# Patient Record
Sex: Male | Born: 1985 | Race: White | Hispanic: No | Marital: Married | Smoking: Current every day smoker
Health system: Southern US, Community
[De-identification: ages and names within clinical notes are randomized; demographics above are authoritative.]

---

## 2009-06-09 ENCOUNTER — Emergency Department (HOSPITAL_COMMUNITY): Admission: EM | Admit: 2009-06-09 | Discharge: 2009-06-09 | Payer: Self-pay | Admitting: Family Medicine

## 2011-08-16 ENCOUNTER — Ambulatory Visit: Payer: Self-pay | Admitting: Family Medicine

## 2011-08-16 VITALS — BP 115/62 | HR 88 | Temp 98.7°F | Resp 18 | Ht 70.5 in | Wt 230.0 lb

## 2011-08-16 DIAGNOSIS — Z021 Encounter for pre-employment examination: Secondary | ICD-10-CM

## 2011-08-16 DIAGNOSIS — Z0289 Encounter for other administrative examinations: Secondary | ICD-10-CM

## 2011-08-16 NOTE — Progress Notes (Signed)
    Date:  08/16/2011   Name:  Shawn [name]   DOB:  11/28/1985   MRN:  [id number]  PCP:  No primary provider on file.    Chief Complaint: Employment Physical   History of Present Illness:  Shawn [name] [name] a 26 y.o. very pleasant male patient who presents with the following:  Here today for a DOT exam.  He last had his eyes checked a year ago.  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  He was told that corrective lenses would not help with this.  He [name] otherwise healthy and has no other concerns today.  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   He currently [name] a Visual merchandiser, has not had any driving accidents and does not have any trouble day to day with his vision.  There [name] no problem list on file for this patient.   No past medical history on file.  No past surgical history on file.  History  Substance Use Topics  . Smoking status: Current Everyday Smoker -- 0.5 packs/day for 5 years    Types: Cigarettes  . Smokeless tobacco: Not on file  . Alcohol Use: Not on file    No family history on file.  No Known Allergies  Medication list has been reviewed and updated.  No current outpatient prescriptions on file prior to visit.    Review of Systems:  As per HPI- otherwise negative. He [name] otherwise healthy  Physical Examination: Filed Vitals:   08/16/11 1316  BP: 115/62  Pulse: 88  Temp: 98.7 F (37.1 C)  Resp: 18   Filed Vitals:   08/16/11 1316  Height: 5' 10.5" (1.791 m)  Weight: 230 lb (104.327 kg)   Body mass index [name] 32.54 kg/(m^2). Ideal Body Weight: Weight in (lb) to have BMI = 25: 176.4   GEN: WDWN, NAD, Non-toxic, A & O x 3 HEENT: Atraumatic, Normocephalic. Neck supple. No masses, No LAD.  PEERL, EOMI, TM and oropharynx wnl.  Good cervical ROM to compensate for poor right eye vision.    Ears and Nose: No external deformity. CV: RRR, No M/G/R. No JVD. No thrill. No extra heart sounds. PULM: CTA B, no wheezes, crackles, rhonchi. No retractions. No resp. distress. No accessory muscle use. ABD: S, NT, ND, +BS. No rebound. No HSM. EXTR: No c/c/e NEURO Normal gait.  PSYCH: Normally interactive. Conversant. Not depressed or anxious appearing.  Calm demeanor.  Gu: no inguinal hernia.    Assessment and Plan: 1. Physical exam, pre-employment    Due to his amblyopia, Shawn [name] has 20/ 70 vision in his right eye.  Explained that I cannot certify him to drive without an examination and opinion from an optometrist or ophthalmologist.  He [name] frustrated and worried about his future employment, but understands.  Gave him a letter to share with the eye professional of his choice.  If he [name] able to receive a waiver for his vision I can certify him for one year.    Abbe Amsterdam, MD

## 2011-09-23 IMAGING — CR DG ANKLE COMPLETE 3+V*R*
3 series · 3 of 3 positions shown · non-contrast
Comparison: None.

CLINICAL DATA: Ankle pain post fall

RIGHT ANKLE - COMPLETE 3+ VIEW

[view not recorded (1 of 3)]
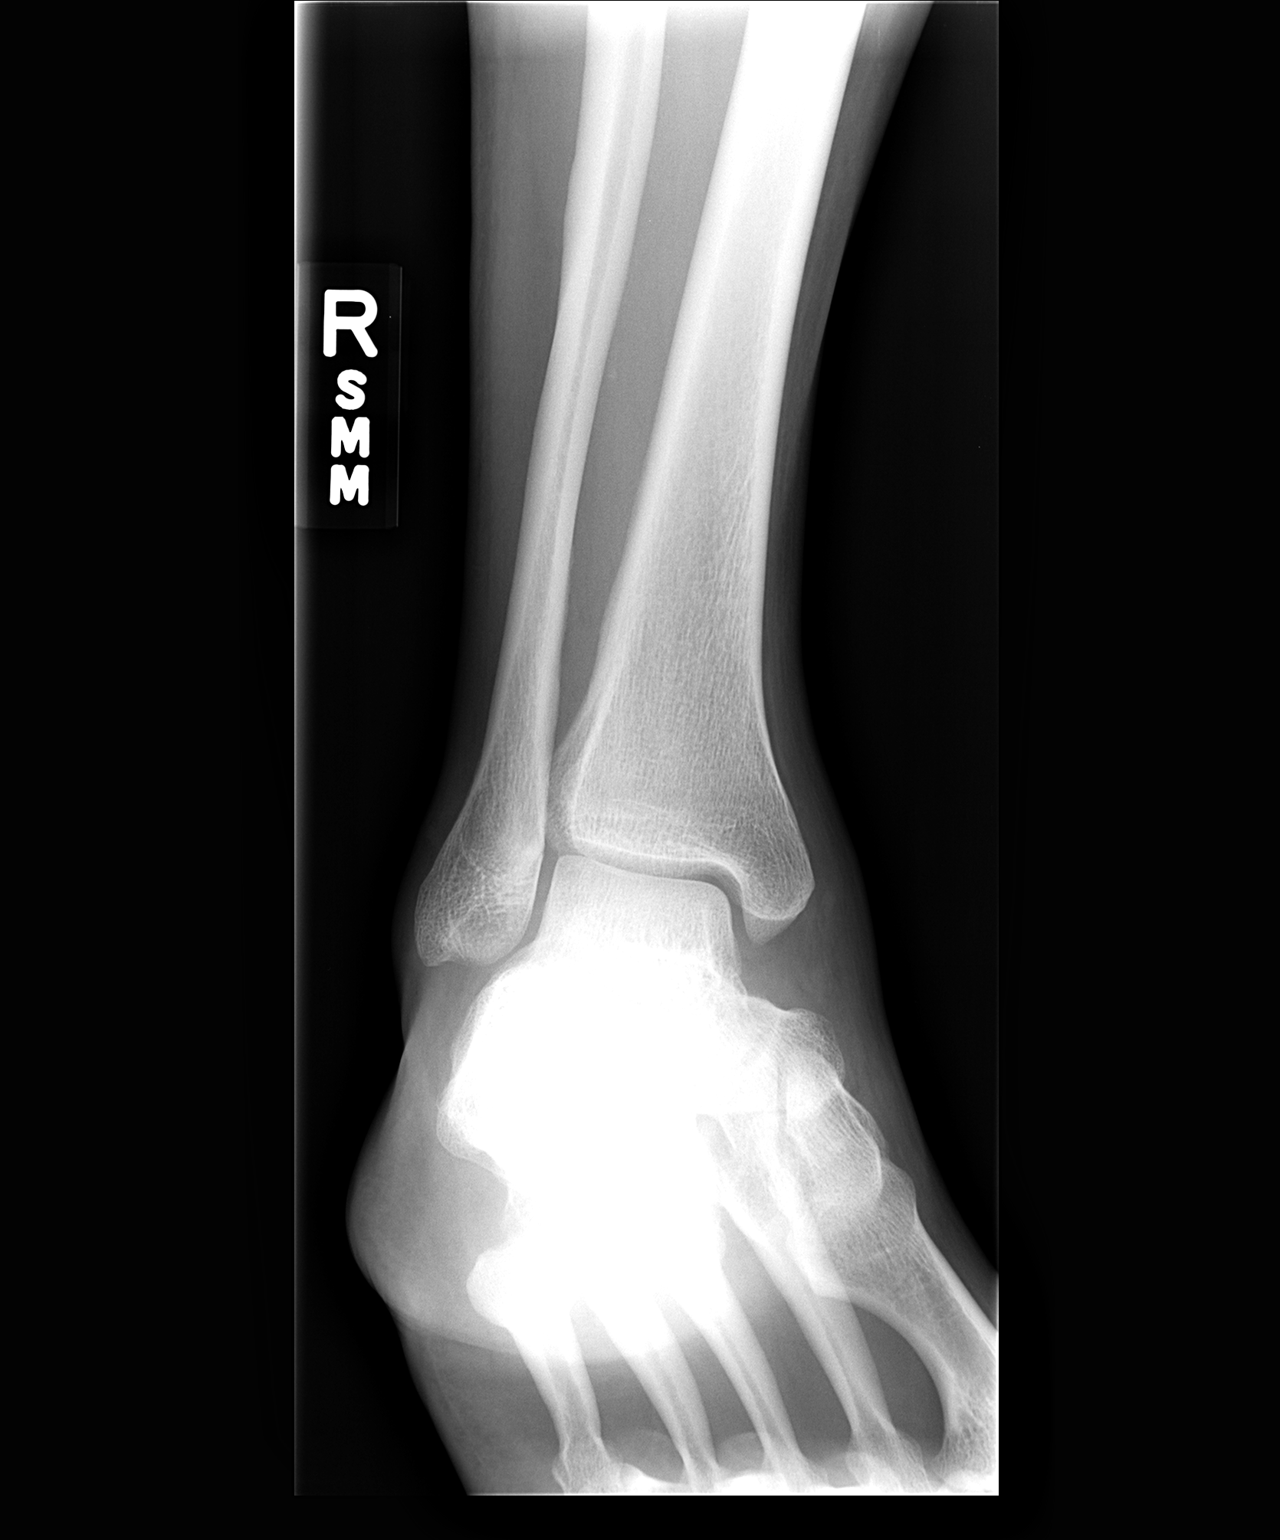

[view not recorded (2 of 3)]
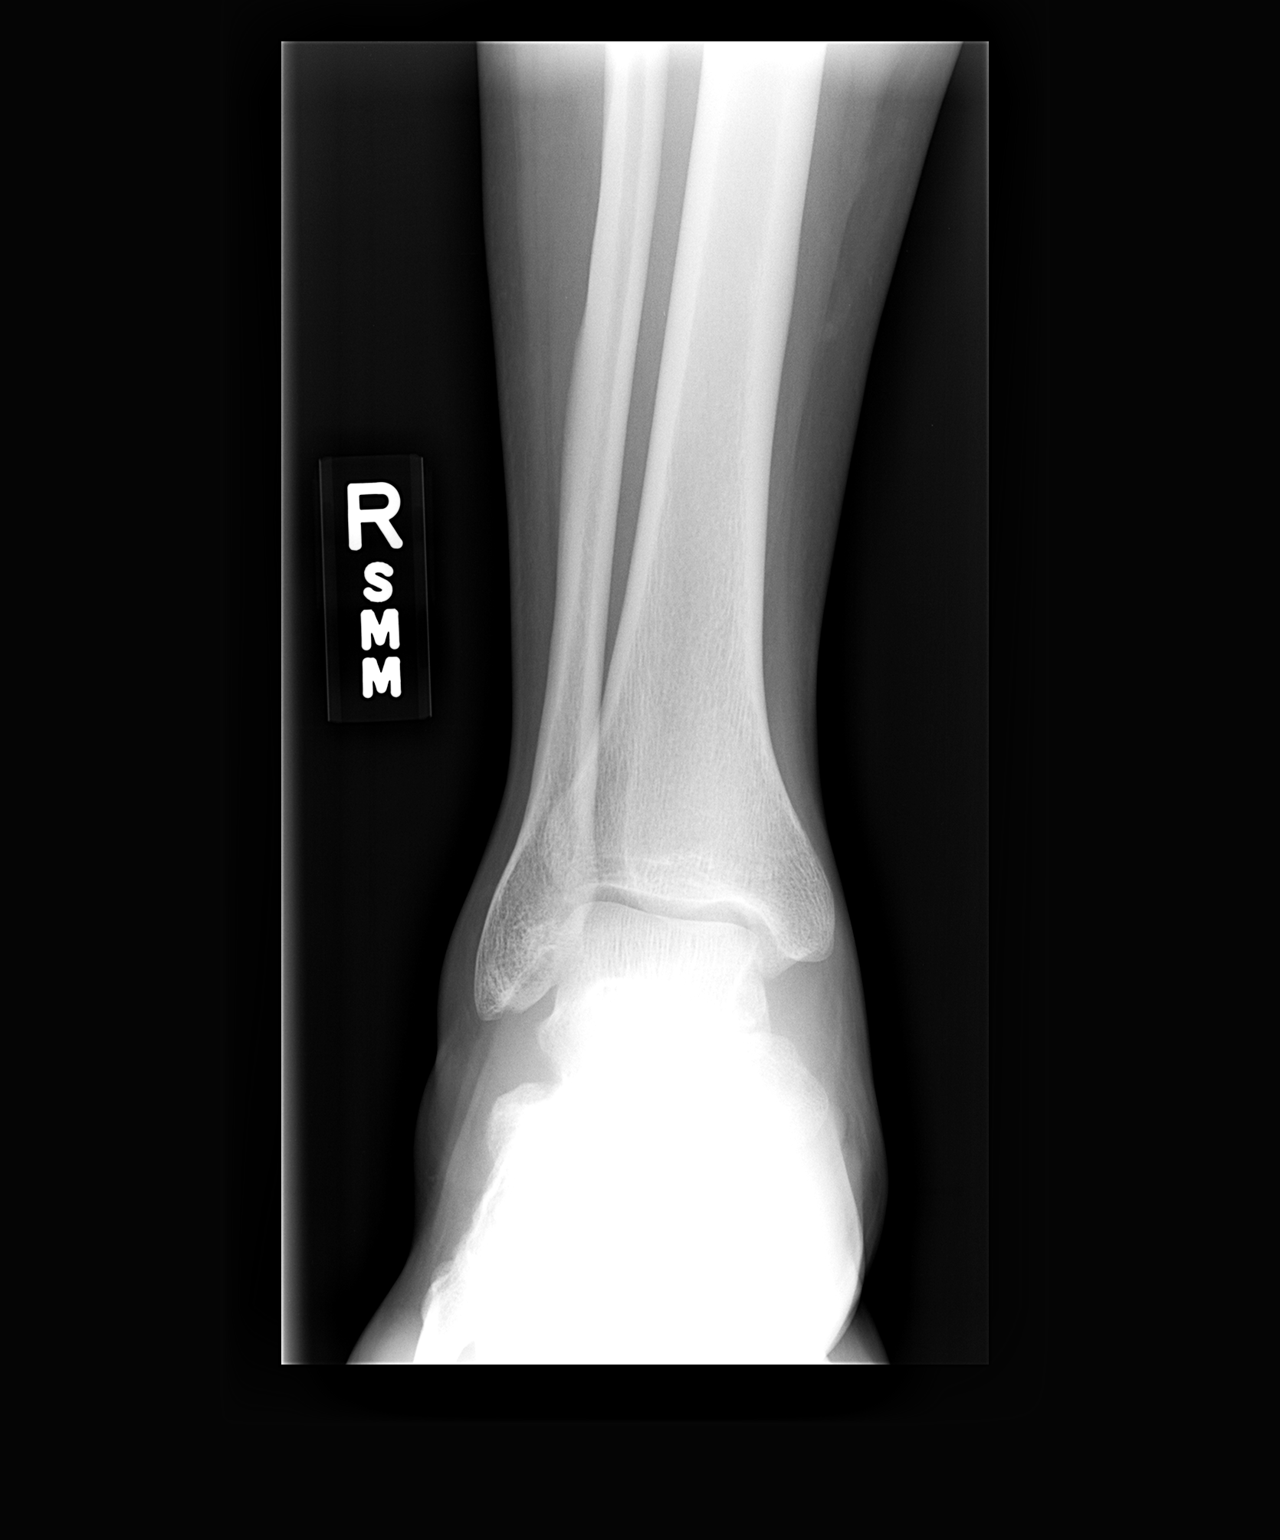

[view not recorded (3 of 3)]
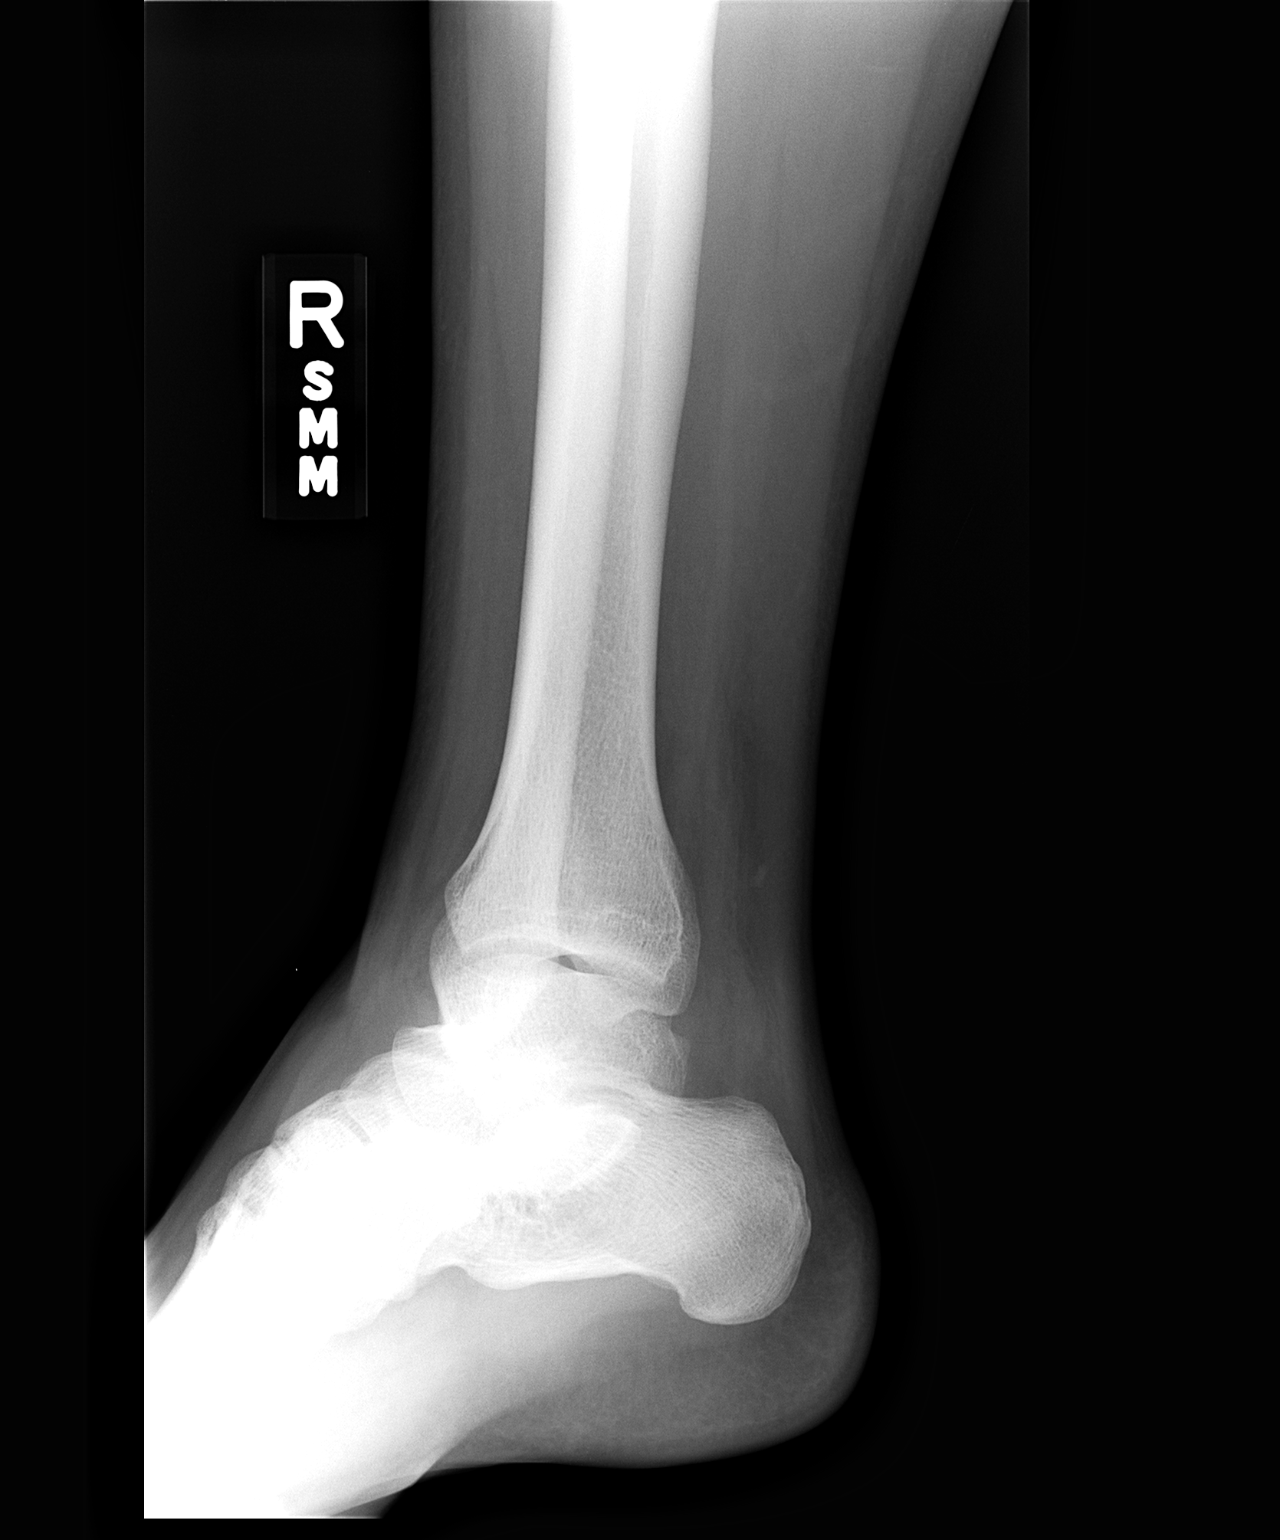

[3 of 3 positions shown; findings below may reference images not displayed]

FINDINGS: Three views of the right ankle submitted.  No acute
fracture or subluxation.  Ankle mortise is preserved.
IMPRESSION: No acute fracture or subluxation.

## 2011-09-23 IMAGING — CR DG FOOT COMPLETE 3+V*R*
3 series · 3 of 3 positions shown · non-contrast
Comparison: None.

CLINICAL DATA: Post fall

RIGHT FOOT COMPLETE - 3+ VIEW

[view not recorded (1 of 3)]
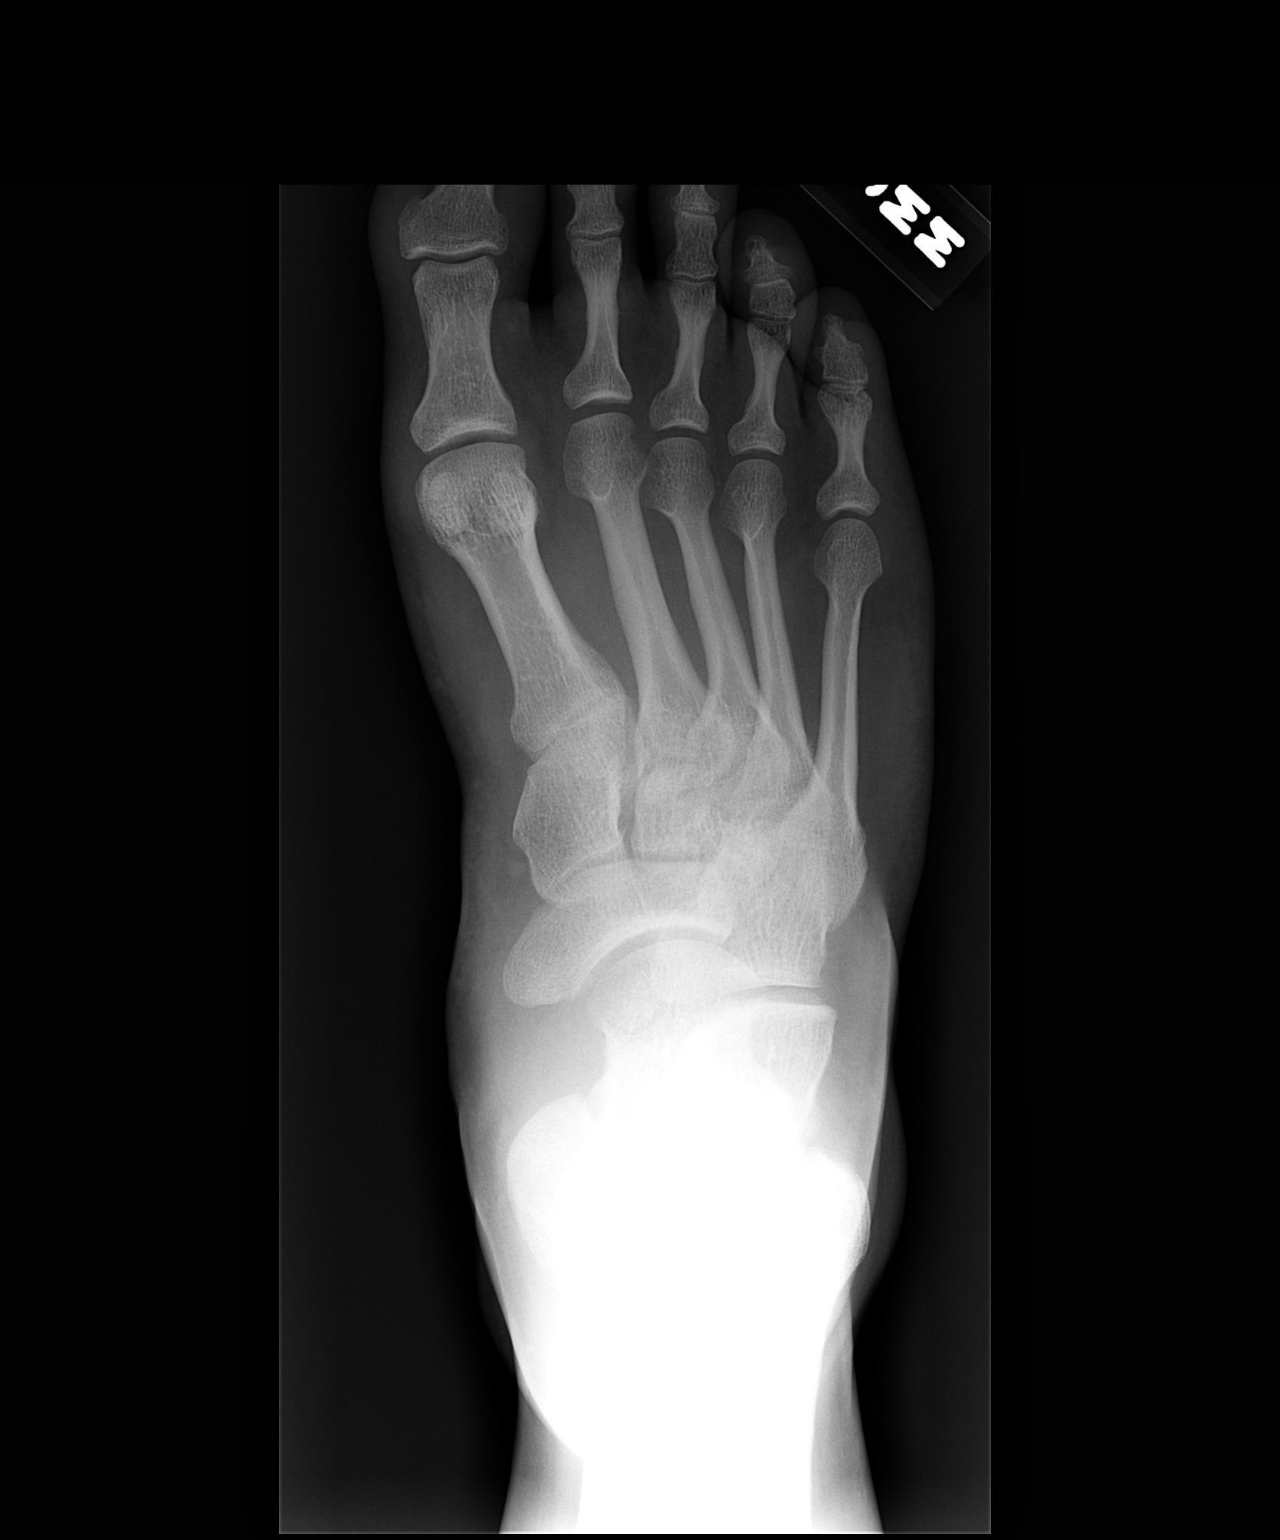

[view not recorded (2 of 3)]
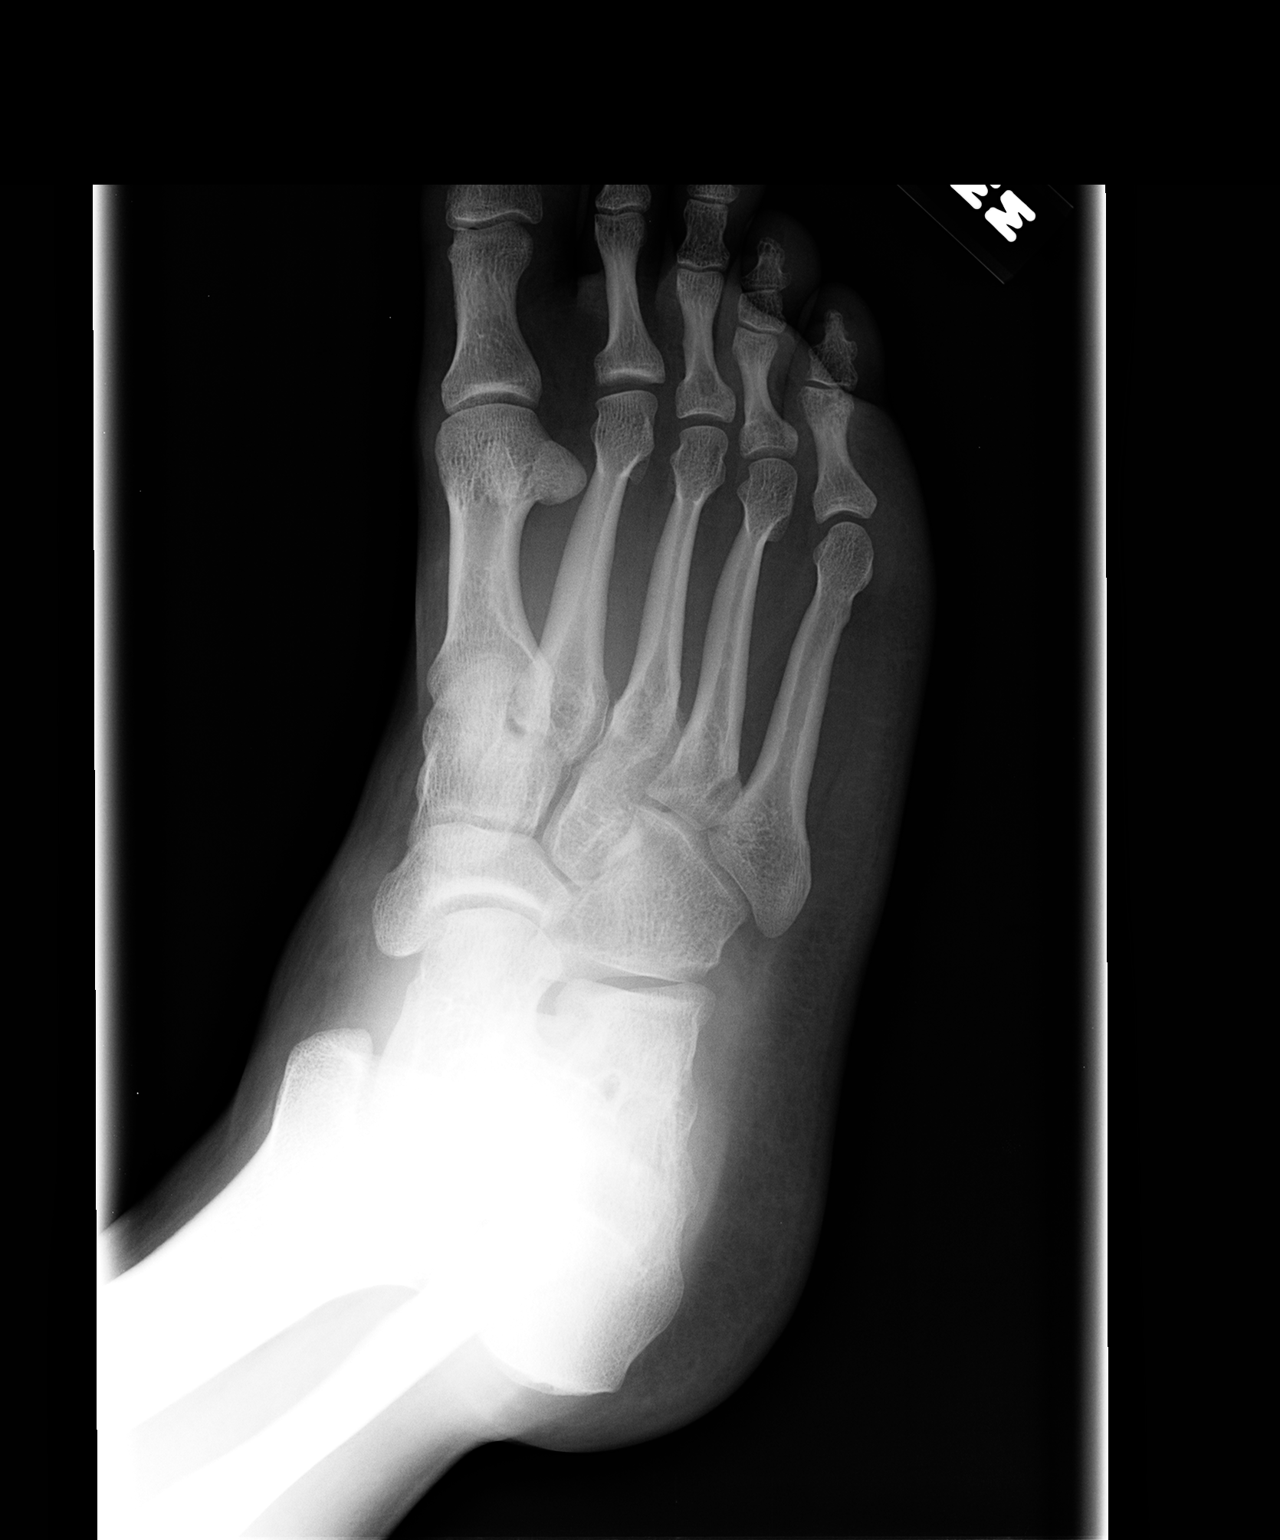

[view not recorded (3 of 3)]
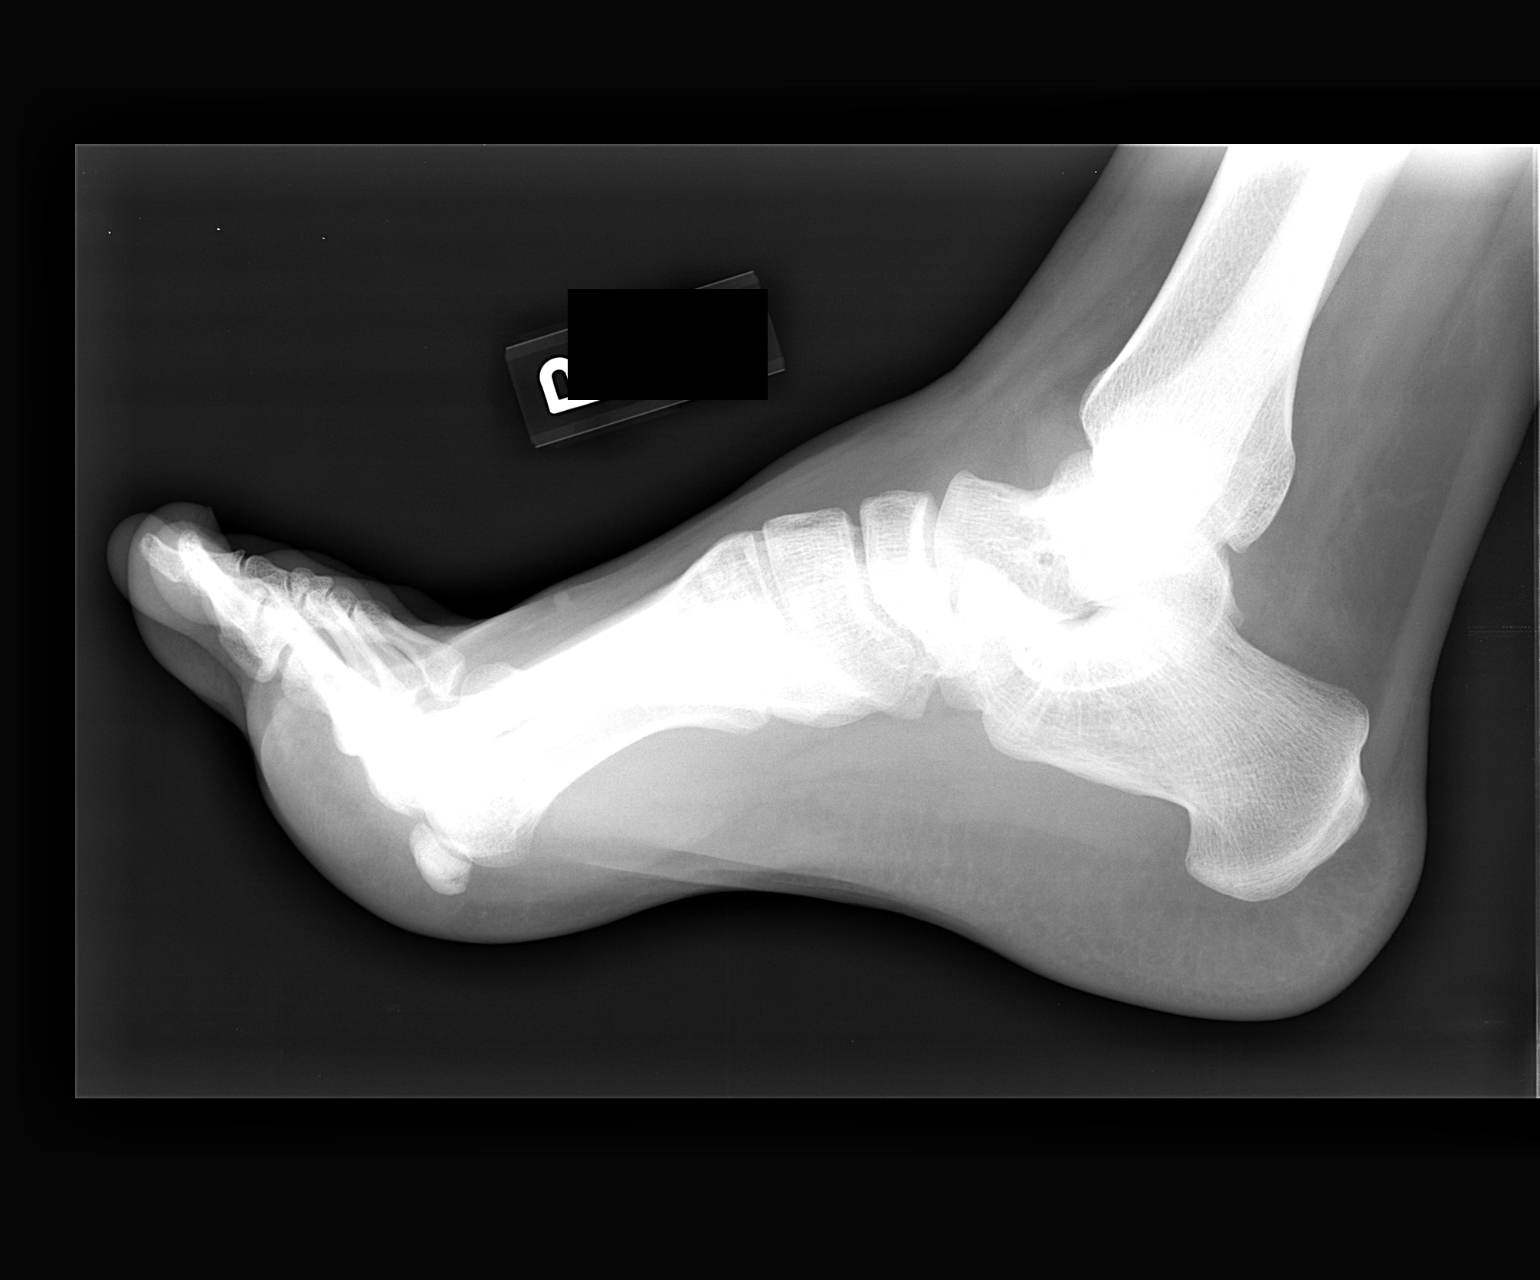

[3 of 3 positions shown; findings below may reference images not displayed]

FINDINGS: Three views of the right foot submitted.  No acute
fracture or subluxation.  No radiopaque foreign body.
IMPRESSION: No acute fracture or subluxation.

## 2012-12-01 ENCOUNTER — Ambulatory Visit: Payer: 59 | Admitting: Physician Assistant

## 2012-12-01 VITALS — BP 115/64 | HR 99 | Temp 98.3°F | Resp 16 | Ht 69.5 in | Wt 229.2 lb

## 2012-12-01 DIAGNOSIS — R509 Fever, unspecified: Secondary | ICD-10-CM

## 2012-12-01 DIAGNOSIS — J029 Acute pharyngitis, unspecified: Secondary | ICD-10-CM

## 2012-12-01 LAB — POCT CBC
Granulocyte percent: 73.4 %G (ref 37–80)
HCT, POC: 47.5 % (ref 43.5–53.7)
Hemoglobin: 15.3 g/dL (ref 14.1–18.1)
Lymph, poc: 0.8 (ref 0.6–3.4)
MCH, POC: 31.2 pg (ref 27–31.2)
MCHC: 32.2 g/dL (ref 31.8–35.4)
MCV: 96.7 fL (ref 80–97)
MID (cbc): 0.3 (ref 0–0.9)
MPV: 8.6 fL (ref 0–99.8)
POC Granulocyte: 3 (ref 2–6.9)
POC LYMPH PERCENT: 18.7 %L (ref 10–50)
POC MID %: 7.9 %M (ref 0–12)
Platelet Count, POC: 207 10*3/uL (ref 142–424)
RBC: 4.91 M/uL (ref 4.69–6.13)
RDW, POC: 12 %
WBC: 4.1 10*3/uL — AB (ref 4.6–10.2)

## 2012-12-01 LAB — POCT INFLUENZA A/B
Influenza A, POC: NEGATIVE
Influenza B, POC: NEGATIVE

## 2012-12-01 LAB — POCT RAPID STREP A (OFFICE): Rapid Strep A Screen: NEGATIVE

## 2012-12-01 NOTE — Progress Notes (Signed)
Subjective:    Patient ID: Shawn [name], male    DOB: [birth year], 27 y.o.   MRN: [id number]  Fever  Associated symptoms include congestion, coughing, headaches and a sore throat. Pertinent negatives include no abdominal pain, chest pain, nausea, vomiting or wheezing.  Sore Throat  Associated symptoms include congestion, coughing and headaches. Pertinent negatives include no abdominal pain, shortness of breath or vomiting.  Headache  Associated symptoms include coughing, a fever and a sore throat. Pertinent negatives include no abdominal pain, dizziness, nausea, rhinorrhea, sinus pressure or vomiting.   27 year old male presents for evaluation of 2 day hx of fever, sore throat, slight nasal congestion, and mild cough.  States symptoms started suddenly and have not improved. He has had documented fever at home of 100.0. He is taking aspirin which does help reduce the fever temporarily. Has hx of strep 4-5 years ago. No known strep contacts.  Does have a 26 year old at home who has been sick with a URI.  Admits that the body aches and chills are the worst part about this illness. Denies otalgia, sinus pain, SOB, wheezing, chest pain, nausea, vomiting, or abdominal pain.   Patient is otherwise healthy with no other concerns today.     Review of Systems  Constitutional: Positive for fever, chills and fatigue.  HENT: Positive for congestion and sore throat. Negative for rhinorrhea and sinus pressure.   Respiratory: Positive for cough. Negative for shortness of breath and wheezing.   Cardiovascular: Negative for chest pain.  Gastrointestinal: Negative for nausea, vomiting and abdominal pain.  Neurological: Positive for headaches. Negative for dizziness.       Objective:   Physical Exam  Constitutional: He is oriented to person, place, and time. He appears well-developed and well-nourished.  HENT:  Head: Normocephalic and atraumatic.  Right Ear: Hearing, tympanic membrane, external ear and ear  canal normal.  Left Ear: Hearing, tympanic membrane, external ear and ear canal normal.  Mouth/Throat: Uvula is midline and mucous membranes are normal. Posterior oropharyngeal erythema present. No oropharyngeal exudate, posterior oropharyngeal edema or tonsillar abscesses.  Eyes: Conjunctivae are normal.  Neck: Normal range of motion. Neck supple.  Cardiovascular: Normal rate, regular rhythm and normal heart sounds.   Pulmonary/Chest: Effort normal and breath sounds normal.  Lymphadenopathy:    He has no cervical adenopathy.  Neurological: He is alert and oriented to person, place, and time.  Psychiatric: He has a normal mood and affect. His behavior is normal. Judgment and thought content normal.     Results for orders placed in visit on 12/01/12  POCT RAPID STREP A (OFFICE)      Result Value Range   Rapid Strep A Screen Negative  Negative  POCT INFLUENZA A/B      Result Value Range   Influenza A, POC Negative     Influenza B, POC Negative    POCT CBC      Result Value Range   WBC 4.1 (*) 4.6 - 10.2 K/uL   Lymph, poc 0.8  0.6 - 3.4   POC LYMPH PERCENT 18.7  10 - 50 %L   MID (cbc) 0.3  0 - 0.9   POC MID % 7.9  0 - 12 %M   POC Granulocyte 3.0  2 - 6.9   Granulocyte percent 73.4  37 - 80 %G   RBC 4.91  4.69 - 6.13 M/uL   Hemoglobin 15.3  14.1 - 18.1 g/dL   HCT, POC 78.2  95.6 - 53.7 %  MCV 96.7  80 - 97 fL   MCH, POC 31.2  27 - 31.2 pg   MCHC 32.2  31.8 - 35.4 g/dL   RDW, POC 46.9     Platelet Count, POC 207  142 - 424 K/uL   MPV 8.6  0 - 99.8 fL        Assessment & Plan:  Acute pharyngitis - Plan: POCT rapid strep A, POCT CBC, Culture, Group A Strep  Fever, unspecified - Plan: POCT Influenza A/B, POCT CBC, Culture, Group A Strep  Throat culture pending Likely viral illness. Rapid strep and flu tests negative. CBC WNL.  Continue ibuprofen tid alternated with tylenol to keep fever down Increase fluids and rest If no improvement in 48 hours, ok to rx  amoxicillin Follow up if symptoms worsen or change.

## 2012-12-04 LAB — CULTURE, GROUP A STREP

## 2015-03-28 ENCOUNTER — Encounter (HOSPITAL_COMMUNITY): Payer: Self-pay | Admitting: Emergency Medicine

## 2015-03-28 ENCOUNTER — Emergency Department (HOSPITAL_COMMUNITY)
Admission: EM | Admit: 2015-03-28 | Discharge: 2015-03-28 | Disposition: A | Discharge status: Home / Self Care | Payer: 59 | Source: Home / Self Care

## 2015-03-28 DIAGNOSIS — J020 Streptococcal pharyngitis: Secondary | ICD-10-CM

## 2015-03-28 MED ORDER — AMOXICILLIN 500 MG PO CAPS: 500.0000 mg | ORAL_CAPSULE | Freq: Two times a day (BID) | ORAL | Status: AC

## 2015-03-28 NOTE — ED Provider Notes (Signed)
CSN: [id number]     Arrival date & time 03/28/15  1313 History   None    Chief Complaint  Patient presents with  . Sore Throat   (Consider location/radiation/quality/duration/timing/severity/associated sxs/prior Treatment) HPI   4 days of fever to 101. Associated w/ HA for the first 2 days then developed body aches Sore throat today Constant and getting significantly worse.  Ibuprofen and day time flu medicine without improvement. The next symptoms worse. Mesentery chest pain, shortness of breath, cough, nausea, vomiting, diarrhea, constipation, rash. [place] Smoker similar symptoms approximately 2 days ago  History reviewed. No pertinent past medical history. History reviewed. No pertinent past surgical history. Family History  Problem Relation Age of Onset  . Dementia Other    Social History  Substance Use Topics  . Smoking status: Current Every Day Smoker -- 0.50 packs/day for 5 years    Types: Cigarettes  . Smokeless tobacco: None  . Alcohol Use: None    Review of Systems Per HPI with all other pertinent systems negative.   Allergies  Review of patient's allergies indicates no known allergies.  Home Medications   Prior to Admission medications   Medication Sig Start Date End Date Taking? Authorizing Provider  amoxicillin (AMOXIL) 500 MG capsule Take 1 capsule (500 mg total) by mouth 2 (two) times daily. 03/28/15   Ozella Rocks, MD   Meds Ordered and Administered this Visit  Medications - No data to display  BP 102/75 mmHg  Pulse 72  Temp(Src) 98.1 F (36.7 C) (Oral)  Resp 16  SpO2 96% No data found.   Physical Exam Physical Exam  Constitutional: oriented to person, place, and time. appears well-developed and well-nourished. No distress.  HENT:  2+ erythematous tonsils. Head: Normocephalic and atraumatic.  Eyes: EOMI. PERRL.  Neck: Normal range of motion.  Cardiovascular: RRR, no m/r/g, 2+ distal pulses,  Pulmonary/Chest: Effort normal and breath sounds  normal. No respiratory distress.  Abdominal: Soft. Bowel sounds are normal. NonTTP, no distension.  Musculoskeletal: Normal range of motion. Non ttp, no effusion.  Neurological: alert and oriented to person, place, and time.  Skin: Skin is warm. No rash noted. non diaphoretic.  Psychiatric: normal mood and affect. behavior is normal. Judgment and thought content normal.   ED Course  Procedures (including critical care time)  Labs Review Labs Reviewed - No data to display  Imaging Review No results found.   Visual Acuity Review  Right Eye Distance:   Left Eye Distance:   Bilateral Distance:    Right Eye Near:   Left Eye Near:    Bilateral Near:         MDM   1. Strep throat    Amox NSAIDs, Tylenol, fluids, rest.    Ozella Rocks, MD 03/28/15 [phone]

## 2015-03-28 NOTE — ED Notes (Signed)
C/o ST onset 4 days associated w/fevers and feeling fatigue Taking OTC cold meds w/no relief A&O x4... No acute distress... Son is being seen for similar sx

## 2015-03-28 NOTE — Discharge Instructions (Signed)
You have strep throat Please take the antibiotic as prescribed Use ibuprofen or tylenol for other symptoms.

## 2021-11-04 DIAGNOSIS — L239 Allergic contact dermatitis, unspecified cause: Secondary | ICD-10-CM | POA: Diagnosis not present

## 2022-08-31 DIAGNOSIS — Z0000 Encounter for general adult medical examination without abnormal findings: Secondary | ICD-10-CM | POA: Diagnosis not present

## 2022-09-17 DIAGNOSIS — Z0000 Encounter for general adult medical examination without abnormal findings: Secondary | ICD-10-CM | POA: Diagnosis not present
